# Patient Record
Sex: Female | Born: 1960
Health system: Southern US, Community
[De-identification: ages and names within clinical notes are randomized; demographics above are authoritative.]

## PROBLEM LIST (undated history)

## (undated) DIAGNOSIS — K5792 Diverticulitis of intestine, part unspecified, without perforation or abscess without bleeding: Secondary | ICD-10-CM

## (undated) DIAGNOSIS — Z87898 Personal history of other specified conditions: Secondary | ICD-10-CM

## (undated) DIAGNOSIS — M25579 Pain in unspecified ankle and joints of unspecified foot: Secondary | ICD-10-CM

## (undated) DIAGNOSIS — R1310 Dysphagia, unspecified: Secondary | ICD-10-CM

## (undated) DIAGNOSIS — M76829 Posterior tibial tendinitis, unspecified leg: Secondary | ICD-10-CM

## (undated) DIAGNOSIS — K449 Diaphragmatic hernia without obstruction or gangrene: Secondary | ICD-10-CM

## (undated) DIAGNOSIS — M21619 Bunion of unspecified foot: Secondary | ICD-10-CM

## (undated) HISTORY — PX: ROTATOR CUFF REPAIR: SHX139

## (undated) HISTORY — DX: Bunion of unspecified foot: M21.619

## (undated) HISTORY — PX: ESOPHAGEAL DILATION: SHX303

## (undated) HISTORY — DX: Posterior tibial tendinitis, unspecified leg: M76.829

## (undated) HISTORY — PX: CALCANEAL OSTEOTOMY: SHX1281

## (undated) HISTORY — DX: Diverticulitis of intestine, part unspecified, without perforation or abscess without bleeding: K57.92

## (undated) HISTORY — DX: Diaphragmatic hernia without obstruction or gangrene: K44.9

## (undated) HISTORY — DX: Dysphagia, unspecified: R13.10

## (undated) HISTORY — DX: Pain in unspecified ankle and joints of unspecified foot: M25.579

## (undated) HISTORY — PX: ESOPHAGOGASTRODUODENOSCOPY: SHX1529

## (undated) HISTORY — DX: Personal history of other specified conditions: Z87.898

---

## 1999-01-24 ENCOUNTER — Other Ambulatory Visit: Admission: RE | Admit: 1999-01-24 | Discharge: 1999-01-24 | Payer: Self-pay | Admitting: *Deleted

## 2000-02-13 ENCOUNTER — Other Ambulatory Visit: Admission: RE | Admit: 2000-02-13 | Discharge: 2000-02-13 | Payer: Self-pay | Admitting: *Deleted

## 2001-03-26 ENCOUNTER — Other Ambulatory Visit: Admission: RE | Admit: 2001-03-26 | Discharge: 2001-03-26 | Payer: Self-pay | Admitting: *Deleted

## 2002-04-29 ENCOUNTER — Other Ambulatory Visit: Admission: RE | Admit: 2002-04-29 | Discharge: 2002-04-29 | Payer: Self-pay | Admitting: *Deleted

## 2002-11-28 ENCOUNTER — Encounter: Admission: RE | Admit: 2002-11-28 | Discharge: 2002-11-28 | Payer: Self-pay | Admitting: Internal Medicine

## 2002-11-28 ENCOUNTER — Encounter: Payer: Self-pay | Admitting: Internal Medicine

## 2003-06-30 ENCOUNTER — Other Ambulatory Visit: Admission: RE | Admit: 2003-06-30 | Discharge: 2003-06-30 | Payer: Self-pay | Admitting: *Deleted

## 2004-08-08 ENCOUNTER — Other Ambulatory Visit: Admission: RE | Admit: 2004-08-08 | Discharge: 2004-08-08 | Payer: Self-pay | Admitting: *Deleted

## 2005-01-04 ENCOUNTER — Ambulatory Visit (HOSPITAL_BASED_OUTPATIENT_CLINIC_OR_DEPARTMENT_OTHER): Admission: RE | Admit: 2005-01-04 | Discharge: 2005-01-04 | Payer: Self-pay | Admitting: Orthopedic Surgery

## 2005-11-06 HISTORY — PX: TRIGGER FINGER RELEASE: SHX641

## 2006-03-20 ENCOUNTER — Ambulatory Visit (HOSPITAL_COMMUNITY): Admission: RE | Admit: 2006-03-20 | Discharge: 2006-03-20 | Payer: Self-pay | Admitting: Gastroenterology

## 2006-03-21 ENCOUNTER — Other Ambulatory Visit: Admission: RE | Admit: 2006-03-21 | Discharge: 2006-03-21 | Payer: Self-pay | Admitting: *Deleted

## 2006-11-06 HISTORY — PX: ENDOMETRIAL ABLATION: SHX621

## 2007-01-09 ENCOUNTER — Encounter: Admission: RE | Admit: 2007-01-09 | Discharge: 2007-01-09 | Payer: Self-pay | Admitting: Radiation Oncology

## 2007-04-15 ENCOUNTER — Other Ambulatory Visit: Admission: RE | Admit: 2007-04-15 | Discharge: 2007-04-15 | Payer: Self-pay | Admitting: *Deleted

## 2008-06-29 ENCOUNTER — Other Ambulatory Visit: Admission: RE | Admit: 2008-06-29 | Discharge: 2008-06-29 | Payer: Self-pay | Admitting: Gynecology

## 2008-08-18 ENCOUNTER — Ambulatory Visit (HOSPITAL_BASED_OUTPATIENT_CLINIC_OR_DEPARTMENT_OTHER): Admission: RE | Admit: 2008-08-18 | Discharge: 2008-08-18 | Payer: Self-pay | Admitting: Orthopedic Surgery

## 2008-11-06 HISTORY — PX: BUNIONECTOMY: SHX129

## 2010-06-02 ENCOUNTER — Ambulatory Visit (HOSPITAL_COMMUNITY): Admission: RE | Admit: 2010-06-02 | Discharge: 2010-06-02 | Payer: Self-pay | Admitting: Gastroenterology

## 2011-03-21 NOTE — Op Note (Signed)
NAME:  Jill Lindsey, Jill Lindsey NO.:  [id number]   MEDICAL RECORD NO.:  [id number]          PATIENT TYPE:  AMB   LOCATION:  DSC                          FACILITY:  [facility]   PHYSICIAN:  Leonides Grills, M.D.     DATE OF BIRTH:  April 19, 1961   DATE OF PROCEDURE:  08/18/2008  DATE OF DISCHARGE:                               OPERATIVE REPORT   PREOPERATIVE DIAGNOSES:  1. Right hallux valgus.  2. Right second hammertoe.   POSTOPERATIVE DIAGNOSES:  1. Right hallux valgus.  2. Right second hammertoe.   OPERATION:  1. Right Chevron bunionectomy.  2. Stress x-rays, right foot.  3. Right great toe digital nerve neurolysis.  4. Right second toe MTP joint dorsal capsulotomy with collateral      release.  5. Right second toe proximal phalanx head resection.  6. Right second toe FDL to proximal phalanx tendon transfer.  7. Right second toe EDB to EDL tendon transfer.   ANESTHESIA:  General.   SURGEON:  Leonides Grills, MD   ASSISTANT:  Richardean Canal, PA   ESTIMATED BLOOD LOSS:  Minimal.   TOURNIQUET TIME:  Approximately an hour.   COMPLICATIONS:  None.   DISPOSITION:  Stable to the PR.   INDICATIONS:  This is a 50 year old female who has had long-standing  right bunion second toe pain that was interfering with her life despite  wearing wider extra-depth shoes, wearing several different pads, and  taking anti-inflammatories.  She was sent for the above procedure.  All  risks including infection, vessel injury, nonunion, malunion, hardware  irritation, hardware failure, persistent pain, worsening pain, prolonged  recovery, stiffness, arthritis, development of recurrent hallux valgus  deformity, development of hallux varus deformity, and development of  recurrence of the hammertoe deformity or cock-up toe deformity were all  explained.  Questions were encouraged and answered.   OPERATION:  The patient was brought to the operating room and placed in  supine position.  After  adequate general endotracheal anesthesia was  administered as well as Ancef 1 g IV piggyback, the right lower  extremity was then prepped and draped in sterile manner over a  proximally placed thigh tourniquet.  Limb was gravity exsanguinated and  tourniquet was inflated to 290 mmHg.  A longitudinal incision in midline  over the medial aspect of the right great toe MTP joint was then made.  Dissection was carried down through the skin.  Hemostasis was obtained.  Great toe dorsomedial digital nerve was then carefully dissected out and  was retracted out of harm's way throughout the procedure.  L-shaped  capsulotomy was then made.  Simple bunionectomy was then performed with  a sagittal saw.  Alona Bene ridge was then rounded off with a rongeur.  Lateral capsule was released with a curved Beaver blade, protecting the  cartilage with a Freer.  This had an outstanding release of the tight  capsule.  The joint and area were copiously irrigated with normal  saline.  Center of the head was then identified medially, marked, and  protecting the soft tissue superiorly and inferiorly.  A Chevron  osteotomy was then made with a sagittal saw.  The head was then  translated about 3-4 mm laterally and fixed with a 2.0-mm fully threaded  cortical set screws using a 1.5-mm drill hole respectively.  This had  excellent purchase and maintenance of the correction.  Redundant bone  medially was trimmed off with a sagittal saw.  Joint and area were  copiously irrigated with normal saline.  Toe was held reduced and the  capsule was reconstructed and advanced both superiorly and proximally  with 2-0 Vicryl stitch.  This had an Conservation officer, historic buildings.  Stress x-rays  were obtained in the AP and lateral planes and showed no gross motion,  fixation, proposition, and excellent as well.  Sesamoid was located.  Again, the area was copiously irrigated with normal saline.  A  longitudinal incision was then made over the  right second toe.  Dissection was carried down through the skin.  Hemostasis was obtained.  The EDB and EDL tendons were identified.  The EDL tendon was tenotomized  proximal medial and the brevis distal lateral and retracted of harm's  way throughout the procedure.  A dorsal capsulotomy with collateral  release of the MTP joint was then performed, protecting the soft tissues  both medial and laterally.  This had an outstanding release of the tight  capsule.  We then skeletonized the distal aspect of the proximal phalanx  and removed the head with a rongeur followed by a bone cutter.  This cut  was made perpendicular to the long axis of proximal phalanx.  A  longitudinal incision was then made into the PIP joint plantar plate.  FDL tendon was then identified and carefully dissected out and then  tenotomized as distal as possible.  We then made a drill hole with a 2.5  followed by 3.5-mm drill into the base of the second metatarsal and then  transferred the FDL to the proximal phalanx to this drill hole from  plantar to dorsal using 3-0 PDS stitch.  This had an Conservation officer, historic buildings.  We then placed a 0.054 K-wire antegrade through the middle and distal  phalanx, reduced the PIP joint as well as the MTP joint, and tension on  the FDL tendon through the drill hole fired the K-wire across the MTP  joint.  This had an Conservation officer, historic buildings.  There was a little been  resistance due to the fact that I think she fractured this toe in the  past and there were some diaphyseal callus.  Once this was done, the toe  was in excellent position.  We then transferred the EDB to EDL dorsally  using 3-0 PDS stitch and again this had an outstanding repair as well.  This was sewn to the stump of the FdL tendon.  K-wire was bent, cut, and  capped.  Skin relieving incisions were made on either side of the K-  wire.  The tourniquet was deflated.  Hemostasis was obtained.  The toe  pinked up nicely.  All wounds were  copiously irrigated with normal  saline.  Subcu was closed with 3-0 Vicryl, skin was closed with 4-0  nylon over all wounds.  Sterile dressing was applied.  Roger-Mann  dressing was applied.  Hard-sole shoe was applied.  The patient was  stable to PR.      Leonides Grills, M.D.  Electronically Signed     PB/MEDQ  D:  08/18/2008  T:  08/18/2008  Job:  [id number]

## 2011-03-24 NOTE — Op Note (Signed)
NAME:  Jill Lindsey, Jill Lindsey NO.:  [id number]   MEDICAL RECORD NO.:  [id number]          PATIENT TYPE:  AMB   LOCATION:  DSC                          FACILITY:  [facility]   PHYSICIAN:  Leonides Grills, M.D.     DATE OF BIRTH:  08-07-1961   DATE OF PROCEDURE:  01/04/2005  DATE OF DISCHARGE:                                 OPERATIVE REPORT   PREOPERATIVE DIAGNOSES:  1.  Left hindfoot malposition.  2.  Posterior tibial tendinitis.  3.  Left tight gastrocnemius.   POSTOPERATIVE DIAGNOSES:  1.  Left hindfoot malposition.  2.  Posterior tibial tendinitis.  3.  Left tight gastrocnemius.   OPERATION:  1.  Left lengthening calcaneal osteotomy.  2.  Left iliac crest bone graft.  3.  Left medializing calcaneal osteotomy.  4.  Left FDL to posterior tendon transfer inside left gastrocnemius slide.   SURGEON:  Leonides Grills, M.D.   ANESTHESIA:  General endotracheal tube -- __________   ESTIMATED BLOOD LOSS:  Minimal.   TOURNIQUET TIME:  Approximately 1-1/2 hours.   COMPLICATIONS:  None.   DISPOSITION:  Stable to recovery room.   INDICATIONS:  This is a 50 year old female who has had longstanding left  posteromedial ankle pain that was interfering with her life.  Once consent  was obtained she was sent for the above procedure.  All risks which included  infection, neurovascular injury, nonunion, malunion, hardware failure,  persistent pain, worsening stiffness, arthritis, same pain, worsening pain,  prolonged recovery, hardware irritation, hardware failure, and hardware  removal were all explained; questions were encouraged and answered.   DESCRIPTION OF PROCEDURE:  The patient was brought to the operating room and  placed in the supine position after adequate general endotracheal anesthesia  was administered as well as Ancef 1 gm IV piggyback.  The left lower  extremity as well as the left iliac crest bone graft site were prepped and  draped in a sterile manner over a  proximally placed thigh tourniquet.   We started the procedure with a longitudinal incision over the left iliac  crest.  Dissection was carried down through the skin and hemostasis was  obtained.  Dissection was carried down directly to the crest.  Soft tissue  was elevated over the inner and outer tables and we were using a sagittal  saw and a curved 1/4-inch osteotome; an approximately 8-to-10-mm wide based  tricortical bone block was then obtained.  This was trapezoidal shaped.  It  was placed on the back table for later lengthening.  This area was copiously  irrigated with normal saline.  Bone wax was applied to expose the bony  surfaces.  The area was copiously irrigated with normal saline.  Marcaine  was placed in the wound subcu.  Once this was basted for several minutes the  Marcaine was then sucked out.  The area was copiously irrigated with normal  saline and the deep tissues were closed with #0 Vicryl, subcu closed with 2-  0 Vicryl and the skin was closed with 4-0 Monocryl subcuticular stitch.  Steri-Strips were applied.  We then placed a longitudinal incision over the medial gastrocnemius muscle  tendinous junction. Dissection was carried down through skin.  Hemostasis  was obtained.  The fascia was then opened in line with the incision.  A  tucked conjoined region was then developed between the gastrosoleus.  Soft  tissue was then elevated off the posterior aspect of the gastrocnemius  tendon.  Sural nerve was identified and protected posteriorly.  Gastrocnemius was then released with the Mayo scissors with release of the  tight gastrocnemius.  The area was copiously irrigated with normal saline.  The subcu was closed with 3-0 Vicryl and the skin was closed with 4-0  Monocryl subcuticular stitch.  Steri-Strips were applied.   The limb was then gravity exsanguinated and tourniquet was elevated to 290  mmHg. A longitudinal incision of the posterior tibial tendon was then  made  and dissection was carried down through skin and hemostasis was obtained.  The flexor retinaculum was then opened over the posterior tibial tendon.  The posterior tendon was diseased.  There was no distinct tear. It was  swollen, but we decided to keep the tendon, at this point.  We then  identified the FDL tendon and traced this to the knot of Henry.  This was  then tenotomized and left in the wound for later transfer.  We then went to  the lateral aspect of the foot and made  a longitudinal incision over the  neck of the calcaneus.  Dissection was carried down through the skin and  hemostasis was obtained.  Soft tissue was then elevated over either aspect  of the calcaneal neck.  The CC joint was then identified.  Approximately 1.2  cm proximal to this an osteotomy was made with the sagittal saw  perpendicular to the lateral wall parallel to the CC joint.  We then placed  a small Lambert spreader and fashioned the tricortical bone block.  This was  then tapped into placed and then affixed with a 3.5 mm fully threaded  cortical screw using a 2.5 mm drill hole respectively.  This had an  excellent repair.  This was flush on all surfaces and did not have any  significant impingement.   We then made a longitudinal incision over the lateral calcaneal tuber.  Dissection was carried down directly to bone.  The soft tissue was elevated  over either aspect, superiorly and inferiorly over the calcaneal tuber.  Homan's were then placed on either side and with a 4000 sagittal saw an  osteotomy was then created.  A straight osteotome followed by a large  Lambert spreader was then placed.  This stretched the soft tissues and then  the calcaneus was then medialized approximately 8-10 mm.  This was then  provisionally fixed with a 2.0 K-wire.  A longitudinal incision was then  made midline over the posterior aspect of the calcaneus at the __________ skin border. Dissection was carried down  directly to bone.  Two 6.5 mm, 16  mm threaded cancellous screws, were then placed using 2.5 and 3.2 mm drill  holes respectively.  This had excellent purchase and maintenance of the  correction.   Stress x-rays were obtained in the lateral, axial, and AP hind foot and  showed that the fixations were in proper position and there was no gross  motion of the osteotomy site and the correction was excellent.  We then went  back to the medial aspect of the foot and we then transferred the FDL to  the  posterior tibial tendon using 2-0 FiberWire.  This had an excellent repair.  The area was copiously irrigated with normal saline.  The tourniquet was  inflated.  Hemostasis was obtained.  The flexor retinaculum was closed with 2-0 Vicryl.  The subcu was closed with 2-0 Vicryl. Skin was closed with 4-0 Vicryl over  all wounds.  Sterile dressing was applied.  Modified Jones dressing was  applied with the ankle in neutral dorsiflexion.  The patient was stable to  the RR.      PB/MEDQ  D:  01/04/2005  T:  01/04/2005  Job:  [id number]

## 2011-03-24 NOTE — Op Note (Signed)
NAME:  Jill Lindsey, Jill Lindsey                  ACCOUNT NO.:  [id number]   MEDICAL RECORD NO.:  [id number]          PATIENT TYPE:  AMB   LOCATION:  ENDO                         FACILITY:  [facility]   PHYSICIAN:  James L. Malon Kindle., M.D.DATE OF BIRTH:  07/20/1961   DATE OF PROCEDURE:  03/20/2006  DATE OF DISCHARGE:                                 OPERATIVE REPORT   PROCEDURE PERFORMED:  Esophagogastroduodenoscopy with Savary dilatation.   MEDICATIONS:  Cetacaine spray, fentanyl 75 mcg, Versed 8 mg IV.   ENDOSCOPIST:  Llana Aliment. Randa Evens, M.D.   INDICATIONS FOR PROCEDURE:  The patient has had persistent episodic  dysphagia, has had a history of heartburn, had a barium swallow that showed  narrowing GE junction with hang up of a barium tablet.  The patient also had  a large hiatal hernia.  For this reason, we felt we should do it initially  under fluoroscopy.   DESCRIPTION OF PROCEDURE:  The procedure had been explained to the patient  and consent obtained.  With the patient in left lateral decubitus position,  the Olympus scope was inserted and advanced.  In the distal esophagus there  was a very wide stricture which was more of a ring but almost was the  diameter of the distended esophagus.  This would clearly not have caused any  type of problems.  We then came to the distal esophagus.  There was a rather  large hiatal hernia.  Right at the junction there was a ring or stricture  narrow but did allow passage of the scope.  A complete endoscopy was  performed.  The scope was passed down into the duodenum. The stomach was  normal on forward and retroflex view.  The duodenal bulb and second duodenum  were normal.  I then placed a Savary guidewire through the scope into the  second duodenum and confirmed with fluoroscopy and withdrew the scope.  Then  with the patient's neck extended, we passed a 39, 42 and 45 French Savary  dilator with a small amount of heme with a 45 [place] dilator.  There were no  immediate complications.  The dilator and guidewire were withdrawn as a  unit.  The patient went to recovery room.   ASSESSMENT:  Esophageal stricture at the gastroesophageal junction, dilated  to 45 [place].   PLAN:  Will give clear liquids until morning, slowly advance diet.  Have her  call and report in one week if she is still having dysphagia, further  dilation can be performed.           ______________________________  Llana Aliment. Malon Kindle., M.D.     Waldron Session  D:  03/20/2006  T:  03/21/2006  Job:  [id number]   cc:   Dellis Anes. Idell Pickles, M.D.  Fax: [phone]

## 2011-03-30 ENCOUNTER — Other Ambulatory Visit: Payer: Self-pay | Admitting: Family Medicine

## 2011-03-30 ENCOUNTER — Other Ambulatory Visit (HOSPITAL_COMMUNITY)
Admission: RE | Admit: 2011-03-30 | Discharge: 2011-03-30 | Disposition: A | Discharge status: Home / Self Care | Payer: 59 | Source: Ambulatory Visit | Attending: Family Medicine | Admitting: Family Medicine

## 2011-03-30 DIAGNOSIS — Z124 Encounter for screening for malignant neoplasm of cervix: Secondary | ICD-10-CM | POA: Insufficient documentation

## 2012-03-14 ENCOUNTER — Other Ambulatory Visit: Payer: Self-pay | Admitting: Family Medicine

## 2012-03-14 DIAGNOSIS — R1032 Left lower quadrant pain: Secondary | ICD-10-CM

## 2012-03-15 ENCOUNTER — Ambulatory Visit
Admission: RE | Admit: 2012-03-15 | Discharge: 2012-03-15 | Disposition: A | Discharge status: Home / Self Care | Payer: 59 | Source: Ambulatory Visit | Attending: Family Medicine | Admitting: Family Medicine

## 2012-03-15 DIAGNOSIS — R1032 Left lower quadrant pain: Secondary | ICD-10-CM

## 2012-03-15 MED ORDER — IOHEXOL 300 MG/ML  SOLN
100.0000 mL | Freq: Once | INTRAMUSCULAR | Status: AC | PRN
  Administered 2012-03-15: 100 mL via INTRAVENOUS

## 2012-06-16 IMAGING — CT CT ABD-PELV W/ CM
2 of 5 series · 13 of 32 positions shown, 18 images · IV contrast (READICAT/WATER & [ID] OMNI 300)
Comparison: None.

CLINICAL DATA: Left lower quadrant pain

CT ABDOMEN AND PELVIS WITH CONTRAST
TECHNIQUE: Multidetector CT imaging of the abdomen and pelvis was
performed following the standard protocol during bolus
administration of intravenous contrast.
Contrast: 100mL OMNIPAQUE IOHEXOL 300 MG/ML  SOLN

[Series 2: abd/pelvis with · axial · 0.86mm/px · z∈[-348,-48]mm · 5 of 90 slices shown, 10 images]
[im 15/90  soft-tissue]
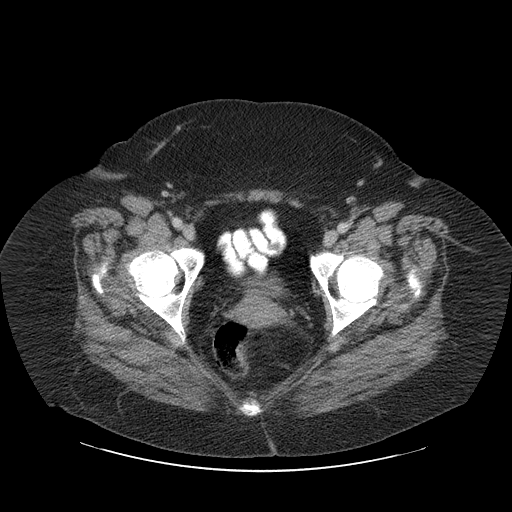
[im 15/90  bone]
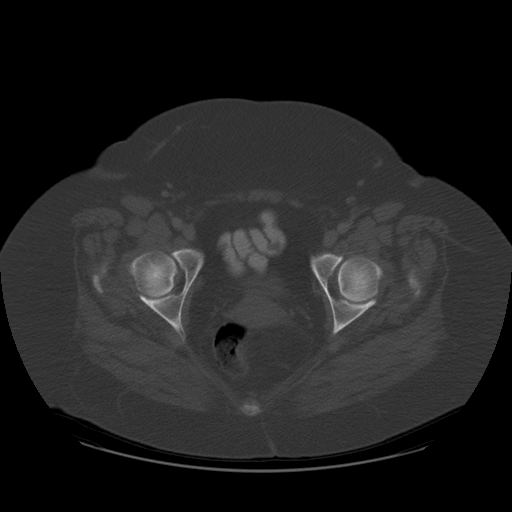
[im 30/90  soft-tissue]
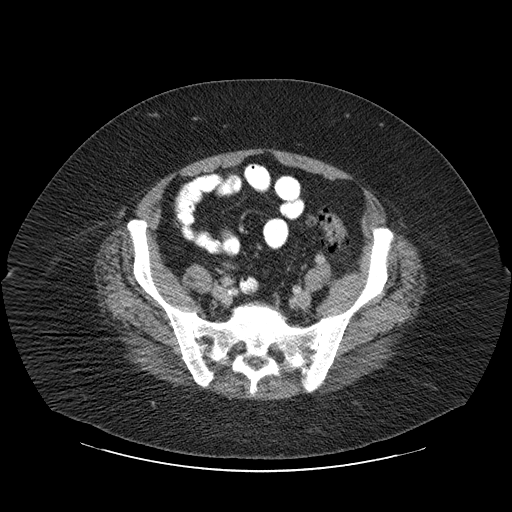
[im 30/90  lung]
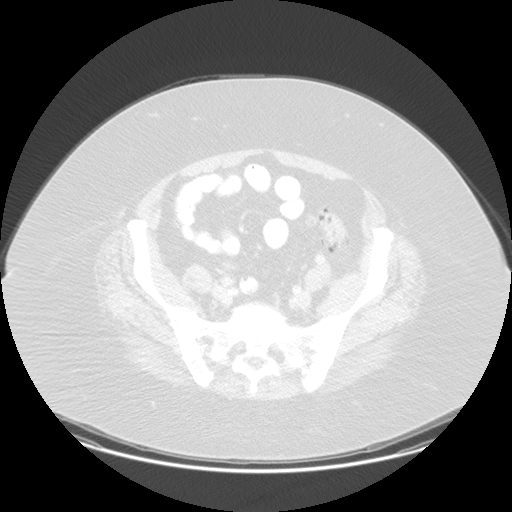
[im 45/90  soft-tissue]
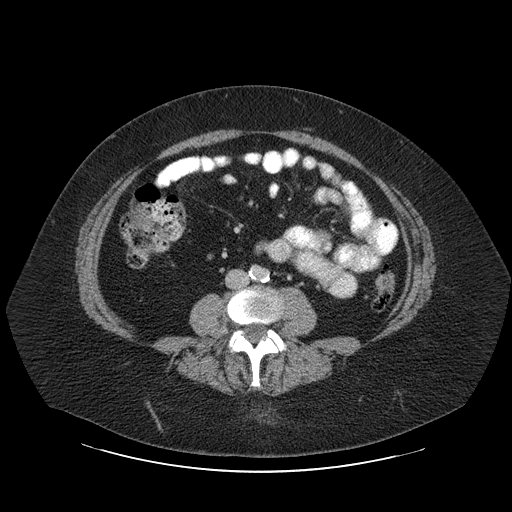
[im 45/90  lung]
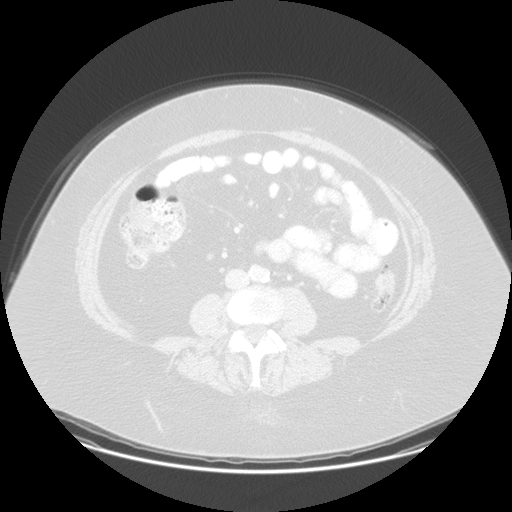
[im 60/90  soft-tissue]
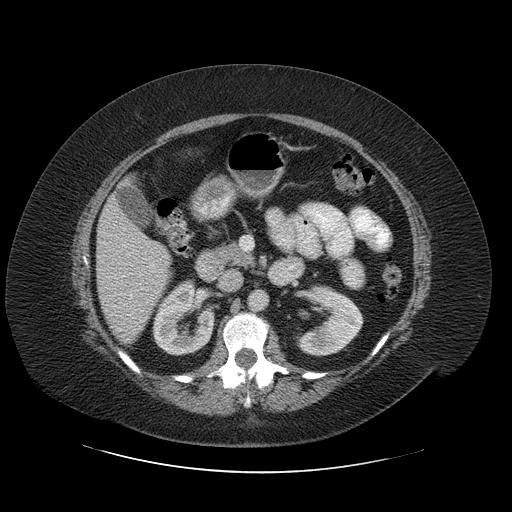
[im 60/90  lung]
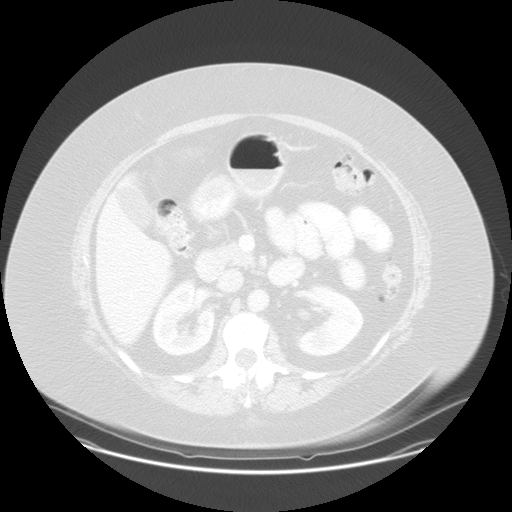
[im 75/90  soft-tissue]
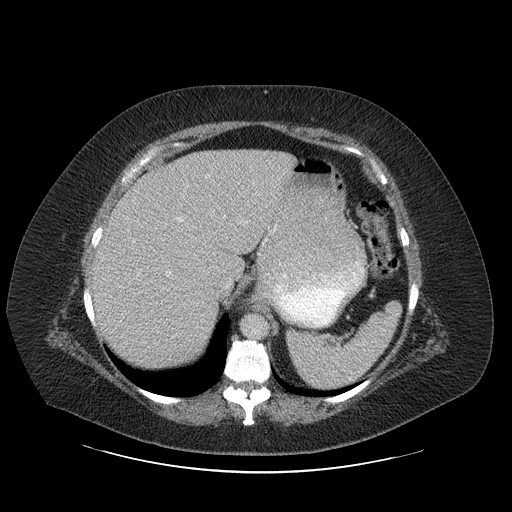
[im 75/90  lung]
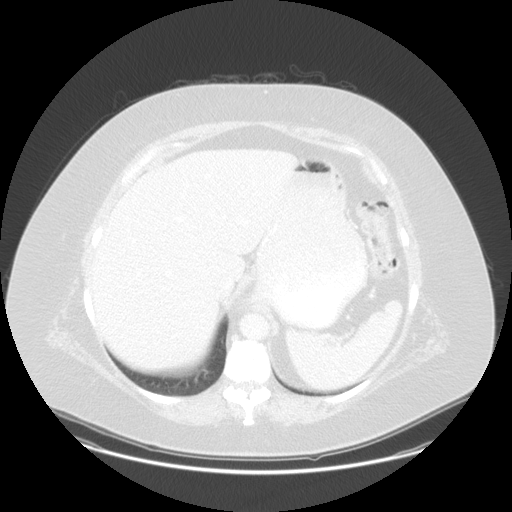

[Series 400: sag · sagittal · 0.89mm/px · 8 of 175 slices shown]
[im 16/175  soft-tissue]
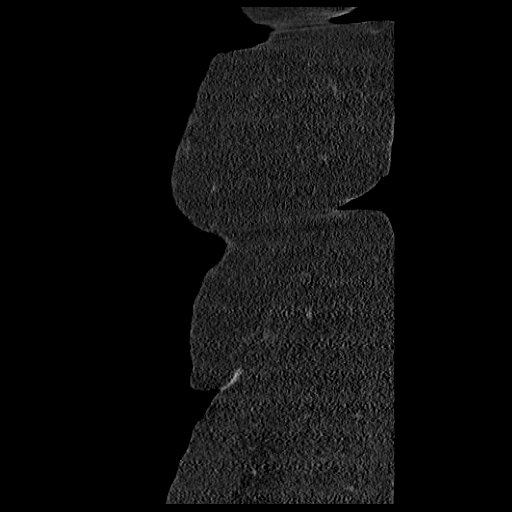
[im 32/175  soft-tissue]
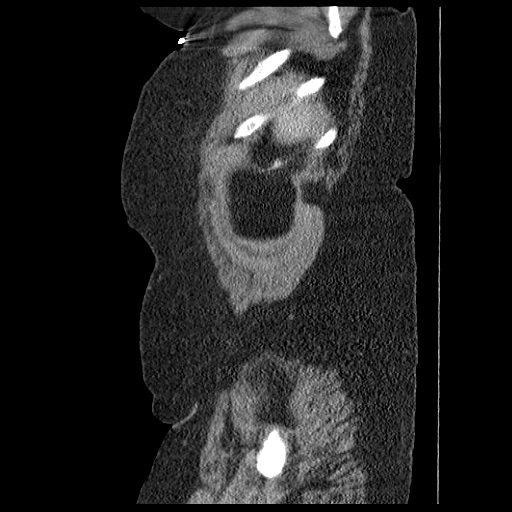
[im 64/175  soft-tissue]
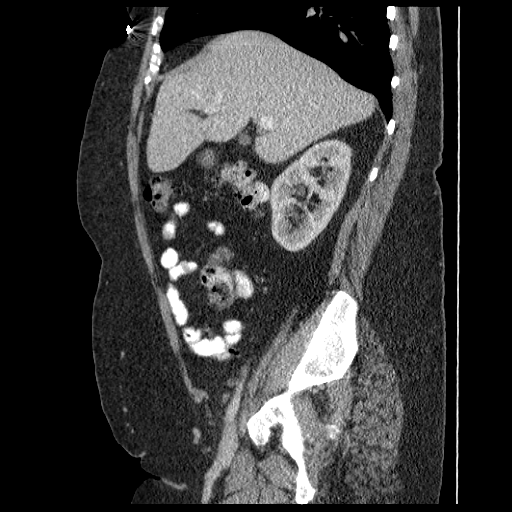
[im 80/175  soft-tissue]
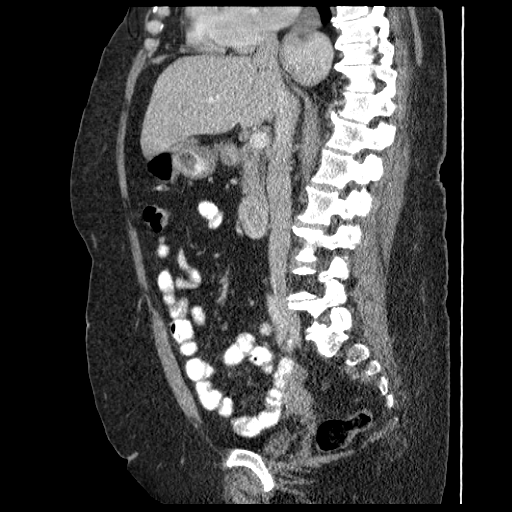
[im 95/175  soft-tissue]
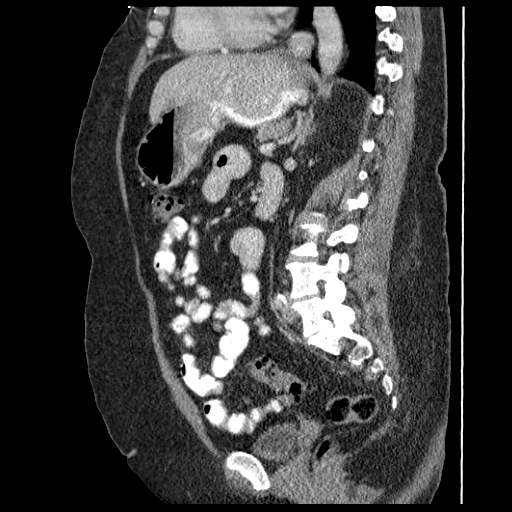
[im 111/175  soft-tissue]
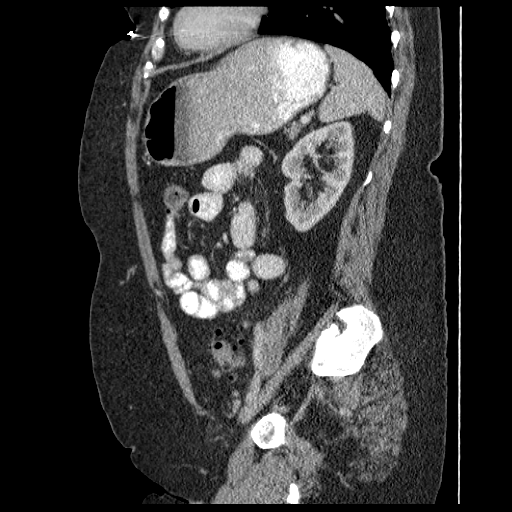
[im 143/175  soft-tissue]
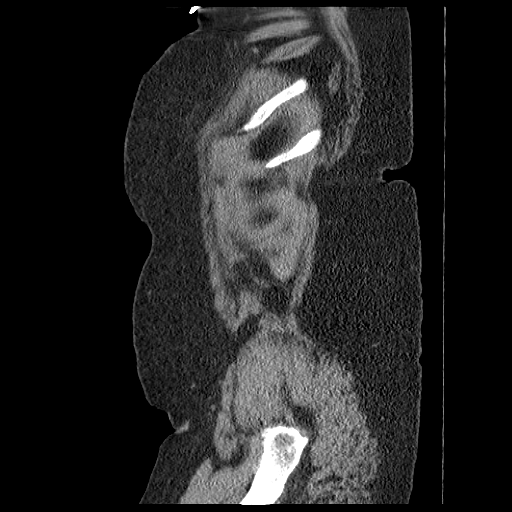
[im 159/175  soft-tissue]
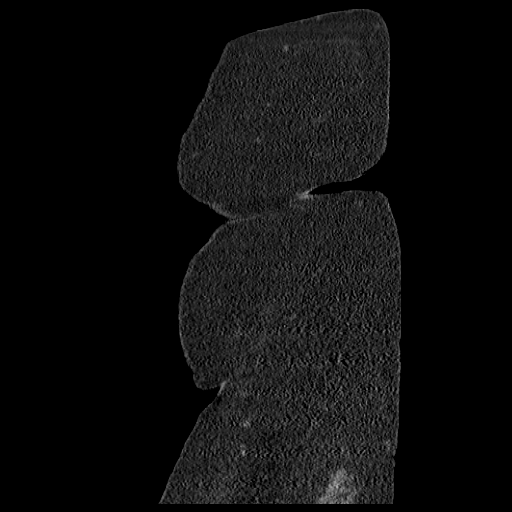

[13 of 32 positions shown; findings below may reference images not displayed]

FINDINGS: Sagittal images of the lumbar spine shows degenerative
changes thoracolumbar spine.  Disc space flattening with mild
anterior and mild posterior spurring noted at L5 S1 and L4-L5
level.

Moderate sized hiatal hernia is noted measures 5.7 x 4.4 cm.  Lung
bases are unremarkable.

Enhanced liver, spleen, pancreas and adrenal glands are
unremarkable.  Gallbladder is contracted without evidence of
calcified gallstones.

Enhanced kidneys are symmetrical in size.  No hydronephrosis or
hydroureter.

Delayed renal images shows bilateral renal symmetrical excretion.
Mild atherosclerotic calcifications are noted abdominal aorta and
bilateral iliac arteries.

Oral contrast material was given to the patient.  There is no
aortic aneurysm.  No small bowel or colonic obstruction.

No pericecal inflammation.  The terminal ileum is unremarkable.
Normal appendix is clearly visualized in axial image 62 and coronal
image 79.

There are multiple distal left colon diverticula.  Multiple sigmoid
colon diverticula are noted.  In axial image 55 and coronal image
67 there is a mild stranding of the pericolonic fat in the distal
left colon left lower quadrant.  Findings are consistent with mild
diverticulitis.  No pericolonic abscess is noted.  No extraluminal
air or contrast material.  The uterus is small size unremarkable.
No adnexal mass.  The urinary bladder is under distended
unremarkable.  No pelvic ascites or adenopathy.  There is a defect
in the left iliac bone this may be due to prior injury or prior
bone harvest.
IMPRESSION: 1.  Distal left colon and sigmoid colon multiple diverticula are
noted.  There is mild stranding of the pericolonic fat in left
lower quadrant distal left colon best seen in axial image 56.  The
findings are highly suspicious for mild diverticulitis.  No peri
colonic abscess or perforation is noted.
2.  Moderate sized hiatal hernia measures 5.7 x 4.4 cm.
3.  Mild atherosclerotic calcifications of the abdominal aorta and
the iliac arteries.  No aortic aneurysm.
4.  No pericecal inflammation normal appendix.
5.  No hydronephrosis or hydroureter.

## 2013-11-10 ENCOUNTER — Ambulatory Visit: Payer: 59 | Admitting: Internal Medicine

## 2013-11-11 ENCOUNTER — Encounter: Payer: Self-pay | Admitting: Internal Medicine

## 2013-11-21 ENCOUNTER — Ambulatory Visit (INDEPENDENT_AMBULATORY_CARE_PROVIDER_SITE_OTHER): Payer: 59 | Admitting: Internal Medicine

## 2013-11-21 ENCOUNTER — Encounter: Payer: Self-pay | Admitting: Internal Medicine

## 2013-11-21 VITALS — BP 122/72 | HR 64 | Ht 65.0 in | Wt 225.0 lb

## 2013-11-21 DIAGNOSIS — R079 Chest pain, unspecified: Secondary | ICD-10-CM

## 2013-11-21 NOTE — Progress Notes (Signed)
HPI  Patient is a 53 yo who was referred fro evaluation of CP  She is followed by Dr Ihor Dow at [facility].  She has a history of HTN, hyperlipidemia, GERD, esophageal stricture, sleep apnea, former smoker.  HGx with no premature CAD  Grandfather with CHF Died at 27  Patinet says sometimes can have CP while sitting.  Sharp pain  Not Associated with palpitation  Last a moment  1 to 2 sec.   Not with activity.   Goes to gym  Gets on treadmill   Tryies to get HR up for 30 min  Does strength training Sometimes if does plank has muscle spasm. Similar Has GI history  This is different  Cut back on caffeine and salt.      No Known Allergies  Current Outpatient Prescriptions  Medication Sig Dispense Refill  . atorvastatin (LIPITOR) 20 MG tablet Take 1 tablet by mouth daily.      Marland Kitchen esomeprazole (NEXIUM) 20 MG capsule Take 20 mg by mouth daily at 12 noon.      . Lutein 6 MG CAPS Take by mouth daily.      . Multiple Vitamins-Minerals (MULTIVITAMIN PO) Take by mouth daily.      . Omega-3 Fatty Acids (FISH OIL PO) Take by mouth daily.       No current facility-administered medications for this visit.    Past Medical History  Diagnosis Date  . Posterior tibial tendinitis   . Ankle pain   . Dysphagia   . History of heartburn   . Hiatal hernia   . Bunion   . Diverticulitis     Past Surgical History  Procedure Laterality Date  . Bunionectomy  2010  . Esophagogastroduodenoscopy    . Esophageal dilation    . Endometrial ablation  2008  . Trigger finger release  2007  . Calcaneal osteotomy  2006, 2012    right, left    No family history on file.  History   Social History  . Marital Status: Married    Spouse Name: N/A    Number of Children: N/A  . Years of Education: N/A   Occupational History  . Not on file.   Social History Main Topics  . Smoking status: Never Smoker   . Smokeless tobacco: Not on file  . Alcohol Use: No  . Drug Use: No  . Sexual Activity: Not on file    Other Topics Concern  . Not on file   Social History Narrative  . No narrative on file    Review of Systems:  All systems reviewed.  They are negative to the above problem except as previously stated.  Vital Signs: BP 122/72  Pulse 64  Ht 5\' 5"  (1.651 m)  Wt 225 lb (102.059 kg)  BMI 37.44 kg/m2  Physical Exam Patient is in NAD HEENT:  Normocephalic, atraumatic. EOMI, PERRLA.  Neck: JVP is normal.  No bruits.  Lungs: clear to auscultation. No rales no wheezes.  Heart: Regular rate and rhythm. Normal S1, S2. No S3.   No significant murmurs. PMI not displaced.  Abdomen:  Supple, nontender. Normal bowel sounds. No masses. No hepatomegaly.  Extremities:   Good distal pulses throughout. No lower extremity edema.  Musculoskeletal :moving all extremities.  Neuro:   alert and oriented x3.  CN II-XII grossly intact.  EKG  SR 64 bpm    Assessment and Plan:  1.  CP  I am not convinced discomfort is cardiac in origin.  Agree with  cutting back on caffeine.  I would not recomm further testing.  2.  Palpatitations.  Sound like isolated skips.  I would not recomm Rx.  2.  Blood pressure.  BP is good today  3  Morbid obesity  Needs to lose wt to reduce her cardiac risk profile  She knows this  Will try.  4.  Dyslipidemia.  LDL is 102 on lipitor.  Trig are high at 207 Needs to lose wt.  Watch carbs   Both should improve if these changes made  I would not push meds for now.

## 2013-11-21 NOTE — Patient Instructions (Signed)
The current medical regimen is effective;  continue present plan and medications.  Follow up as needed 

## 2014-09-08 ENCOUNTER — Other Ambulatory Visit: Payer: Self-pay | Admitting: Gynecology

## 2014-09-09 LAB — CYTOLOGY - PAP

## 2016-03-06 DIAGNOSIS — M75100 Unspecified rotator cuff tear or rupture of unspecified shoulder, not specified as traumatic: Secondary | ICD-10-CM

## 2016-03-06 HISTORY — DX: Unspecified rotator cuff tear or rupture of unspecified shoulder, not specified as traumatic: M75.100

## 2018-08-06 DIAGNOSIS — M25511 Pain in right shoulder: Secondary | ICD-10-CM | POA: Diagnosis not present

## 2018-08-06 DIAGNOSIS — G8928 Other chronic postprocedural pain: Secondary | ICD-10-CM | POA: Diagnosis not present

## 2018-08-06 DIAGNOSIS — Z9889 Other specified postprocedural states: Secondary | ICD-10-CM | POA: Diagnosis not present

## 2018-08-06 DIAGNOSIS — M6281 Muscle weakness (generalized): Secondary | ICD-10-CM | POA: Diagnosis not present

## 2018-08-22 DIAGNOSIS — M25511 Pain in right shoulder: Secondary | ICD-10-CM | POA: Diagnosis not present

## 2018-08-22 DIAGNOSIS — M6281 Muscle weakness (generalized): Secondary | ICD-10-CM | POA: Diagnosis not present

## 2018-08-22 DIAGNOSIS — G8928 Other chronic postprocedural pain: Secondary | ICD-10-CM | POA: Diagnosis not present

## 2018-09-10 DIAGNOSIS — G8928 Other chronic postprocedural pain: Secondary | ICD-10-CM | POA: Diagnosis not present

## 2018-09-10 DIAGNOSIS — M6281 Muscle weakness (generalized): Secondary | ICD-10-CM | POA: Diagnosis not present

## 2018-09-10 DIAGNOSIS — M25511 Pain in right shoulder: Secondary | ICD-10-CM | POA: Diagnosis not present

## 2018-09-26 DIAGNOSIS — M6281 Muscle weakness (generalized): Secondary | ICD-10-CM | POA: Diagnosis not present

## 2018-09-26 DIAGNOSIS — G8928 Other chronic postprocedural pain: Secondary | ICD-10-CM | POA: Diagnosis not present

## 2018-09-26 DIAGNOSIS — M25511 Pain in right shoulder: Secondary | ICD-10-CM | POA: Diagnosis not present

## 2018-10-10 DIAGNOSIS — G4733 Obstructive sleep apnea (adult) (pediatric): Secondary | ICD-10-CM | POA: Diagnosis not present

## 2018-10-16 DIAGNOSIS — M6281 Muscle weakness (generalized): Secondary | ICD-10-CM | POA: Diagnosis not present

## 2018-10-16 DIAGNOSIS — M25511 Pain in right shoulder: Secondary | ICD-10-CM | POA: Diagnosis not present

## 2018-10-16 DIAGNOSIS — G8928 Other chronic postprocedural pain: Secondary | ICD-10-CM | POA: Diagnosis not present

## 2018-12-05 DIAGNOSIS — M654 Radial styloid tenosynovitis [de Quervain]: Secondary | ICD-10-CM | POA: Diagnosis not present

## 2018-12-05 DIAGNOSIS — M25531 Pain in right wrist: Secondary | ICD-10-CM | POA: Diagnosis not present

## 2018-12-19 DIAGNOSIS — J101 Influenza due to other identified influenza virus with other respiratory manifestations: Secondary | ICD-10-CM | POA: Diagnosis not present

## 2018-12-19 DIAGNOSIS — R6889 Other general symptoms and signs: Secondary | ICD-10-CM | POA: Diagnosis not present

## 2019-01-24 DIAGNOSIS — M25531 Pain in right wrist: Secondary | ICD-10-CM | POA: Diagnosis not present

## 2019-01-24 DIAGNOSIS — M654 Radial styloid tenosynovitis [de Quervain]: Secondary | ICD-10-CM | POA: Diagnosis not present

## 2019-07-01 DIAGNOSIS — G4733 Obstructive sleep apnea (adult) (pediatric): Secondary | ICD-10-CM | POA: Diagnosis not present

## 2019-07-15 DIAGNOSIS — M25531 Pain in right wrist: Secondary | ICD-10-CM | POA: Diagnosis not present

## 2019-07-15 DIAGNOSIS — M654 Radial styloid tenosynovitis [de Quervain]: Secondary | ICD-10-CM | POA: Diagnosis not present

## 2019-09-26 DIAGNOSIS — Z1231 Encounter for screening mammogram for malignant neoplasm of breast: Secondary | ICD-10-CM | POA: Diagnosis not present

## 2019-10-15 DIAGNOSIS — R928 Other abnormal and inconclusive findings on diagnostic imaging of breast: Secondary | ICD-10-CM | POA: Diagnosis not present

## 2021-01-04 DIAGNOSIS — U071 COVID-19: Secondary | ICD-10-CM

## 2021-01-04 HISTORY — DX: COVID-19: U07.1

## 2021-09-07 ENCOUNTER — Other Ambulatory Visit: Payer: Self-pay

## 2021-09-07 ENCOUNTER — Encounter (HOSPITAL_BASED_OUTPATIENT_CLINIC_OR_DEPARTMENT_OTHER): Payer: Self-pay | Admitting: Specialist

## 2021-09-07 NOTE — Progress Notes (Addendum)
Spoke w/ via phone for pre-op interview---patient Lab needs dos----CBC, CMP               Lab results------none COVID test -----patient states asymptomatic no test needed Arrive at -------1015 on 09/15/21 NPO after MN NO Solid Food.  Clear liquids from MN until---0915 Med rec completed Medications to take morning of surgery -----none Diabetic medication -----n/a Patient instructed no nail polish to be worn day of surgery Patient instructed to bring photo id and insurance card day of surgery Patient aware to have Driver (ride ) / caregiver    for 24 hours after surgery - husband Brett Canales or son Reuel Boom  Patient Special Instructions -----Bring CPAP Pre-Op special Istructions -----none Patient verbalized understanding of instructions that were given at this phone interview. Patient denies shortness of breath, chest pain, fever, cough at this phone interview.

## 2021-09-12 ENCOUNTER — Encounter (HOSPITAL_BASED_OUTPATIENT_CLINIC_OR_DEPARTMENT_OTHER): Payer: Self-pay | Admitting: Specialist

## 2021-09-15 ENCOUNTER — Encounter (HOSPITAL_BASED_OUTPATIENT_CLINIC_OR_DEPARTMENT_OTHER)
Admission: RE | Disposition: A | Discharge status: Home / Self Care | Payer: Self-pay | Source: Home / Self Care | Attending: Specialist

## 2021-09-15 ENCOUNTER — Encounter (HOSPITAL_BASED_OUTPATIENT_CLINIC_OR_DEPARTMENT_OTHER): Payer: Self-pay | Admitting: Specialist

## 2021-09-15 ENCOUNTER — Other Ambulatory Visit: Payer: Self-pay

## 2021-09-15 ENCOUNTER — Ambulatory Visit (HOSPITAL_BASED_OUTPATIENT_CLINIC_OR_DEPARTMENT_OTHER): Payer: 59 | Admitting: Anesthesiology

## 2021-09-15 ENCOUNTER — Ambulatory Visit (HOSPITAL_BASED_OUTPATIENT_CLINIC_OR_DEPARTMENT_OTHER)
Admission: RE | Admit: 2021-09-15 | Discharge: 2021-09-15 | Disposition: A | Discharge status: Home / Self Care | Payer: 59 | Attending: Specialist | Admitting: Specialist

## 2021-09-15 DIAGNOSIS — K219 Gastro-esophageal reflux disease without esophagitis: Secondary | ICD-10-CM | POA: Diagnosis not present

## 2021-09-15 DIAGNOSIS — G4730 Sleep apnea, unspecified: Secondary | ICD-10-CM | POA: Diagnosis not present

## 2021-09-15 DIAGNOSIS — S83242A Other tear of medial meniscus, current injury, left knee, initial encounter: Secondary | ICD-10-CM

## 2021-09-15 DIAGNOSIS — Z87891 Personal history of nicotine dependence: Secondary | ICD-10-CM | POA: Insufficient documentation

## 2021-09-15 DIAGNOSIS — X58XXXA Exposure to other specified factors, initial encounter: Secondary | ICD-10-CM | POA: Diagnosis not present

## 2021-09-15 DIAGNOSIS — M25462 Effusion, left knee: Secondary | ICD-10-CM | POA: Insufficient documentation

## 2021-09-15 DIAGNOSIS — S83512A Sprain of anterior cruciate ligament of left knee, initial encounter: Secondary | ICD-10-CM | POA: Insufficient documentation

## 2021-09-15 DIAGNOSIS — S83232A Complex tear of medial meniscus, current injury, left knee, initial encounter: Secondary | ICD-10-CM | POA: Insufficient documentation

## 2021-09-15 DIAGNOSIS — M9420 Chondromalacia, unspecified site: Secondary | ICD-10-CM | POA: Diagnosis not present

## 2021-09-15 DIAGNOSIS — K449 Diaphragmatic hernia without obstruction or gangrene: Secondary | ICD-10-CM | POA: Insufficient documentation

## 2021-09-15 DIAGNOSIS — M94262 Chondromalacia, left knee: Secondary | ICD-10-CM | POA: Insufficient documentation

## 2021-09-15 DIAGNOSIS — Z8616 Personal history of COVID-19: Secondary | ICD-10-CM | POA: Diagnosis not present

## 2021-09-15 HISTORY — PX: KNEE ARTHROSCOPY WITH MEDIAL MENISECTOMY: SHX5651

## 2021-09-15 LAB — CBC
HCT: 40.9 % (ref 36.0–46.0)
Hemoglobin: 13.7 g/dL (ref 12.0–15.0)
MCH: 28.9 pg (ref 26.0–34.0)
MCHC: 33.5 g/dL (ref 30.0–36.0)
MCV: 86.3 fL (ref 80.0–100.0)
Platelets: 228 10*3/uL (ref 150–400)
RBC: 4.74 MIL/uL (ref 3.87–5.11)
RDW: 14 % (ref 11.5–15.5)
WBC: 5.9 10*3/uL (ref 4.0–10.5)
nRBC: 0 % (ref 0.0–0.2)

## 2021-09-15 LAB — COMPREHENSIVE METABOLIC PANEL
ALT: 21 U/L (ref 0–44)
AST: 28 U/L (ref 15–41)
Albumin: 4.5 g/dL (ref 3.5–5.0)
Alkaline Phosphatase: 92 U/L (ref 38–126)
Anion gap: 9 (ref 5–15)
BUN: 13 mg/dL (ref 6–20)
CO2: 26 mmol/L (ref 22–32)
Calcium: 9.7 mg/dL (ref 8.9–10.3)
Chloride: 104 mmol/L (ref 98–111)
Creatinine, Ser: 0.58 mg/dL (ref 0.44–1.00)
GFR, Estimated: >60 mL/min (ref >60)
Glucose, Bld: 98 mg/dL (ref 70–99)
Potassium: 3.8 mmol/L (ref 3.5–5.1)
Sodium: 139 mmol/L (ref 135–145)
Total Bilirubin: 1 mg/dL (ref 0.3–1.2)
Total Protein: 8.1 g/dL (ref 6.5–8.1)

## 2021-09-15 SURGERY — ARTHROSCOPY, KNEE, WITH MEDIAL MENISCECTOMY
Anesthesia: General | Site: Knee | Laterality: Left

## 2021-09-15 MED ORDER — ONDANSETRON HCL 4 MG/2ML IJ SOLN
INTRAMUSCULAR | Status: DC | PRN
  Administered 2021-09-15: 4 mg via INTRAVENOUS

## 2021-09-15 MED ORDER — BUPIVACAINE HCL 0.25 % IJ SOLN
INTRAMUSCULAR | Status: DC | PRN
  Administered 2021-09-15: 20 mL

## 2021-09-15 MED ORDER — CEFAZOLIN SODIUM-DEXTROSE 2-4 GM/100ML-% IV SOLN
INTRAVENOUS | Status: AC
  Filled 2021-09-15: qty 100

## 2021-09-15 MED ORDER — CEFAZOLIN SODIUM-DEXTROSE 2-4 GM/100ML-% IV SOLN
2.0000 g | INTRAVENOUS | Status: AC
  Administered 2021-09-15: 2 g via INTRAVENOUS

## 2021-09-15 MED ORDER — OXYCODONE HCL 5 MG PO TABS: 5.0000 mg | ORAL_TABLET | ORAL | 0 refills | Status: AC | PRN

## 2021-09-15 MED ORDER — ACETAMINOPHEN 500 MG PO TABS
ORAL_TABLET | ORAL | Status: AC
  Filled 2021-09-15: qty 2

## 2021-09-15 MED ORDER — ROPIVACAINE HCL 5 MG/ML IJ SOLN
INTRAMUSCULAR | Status: DC | PRN
  Administered 2021-09-15: 20 mL via INTRA_ARTICULAR

## 2021-09-15 MED ORDER — EPHEDRINE 5 MG/ML INJ
INTRAVENOUS | Status: AC
  Filled 2021-09-15: qty 5

## 2021-09-15 MED ORDER — LIDOCAINE 2% (20 MG/ML) 5 ML SYRINGE
INTRAMUSCULAR | Status: AC
  Filled 2021-09-15: qty 5

## 2021-09-15 MED ORDER — MIDAZOLAM HCL 2 MG/2ML IJ SOLN
2.0000 mg | Freq: Once | INTRAMUSCULAR | Status: AC
  Administered 2021-09-15: 2 mg via INTRAVENOUS

## 2021-09-15 MED ORDER — MORPHINE SULFATE (PF) 4 MG/ML IV SOLN
INTRAVENOUS | Status: AC
  Filled 2021-09-15: qty 1

## 2021-09-15 MED ORDER — SODIUM CHLORIDE 0.9 % IR SOLN
Status: DC | PRN
  Administered 2021-09-15: 6000 mL

## 2021-09-15 MED ORDER — KETOROLAC TROMETHAMINE 30 MG/ML IJ SOLN
INTRAMUSCULAR | Status: AC
  Filled 2021-09-15: qty 1

## 2021-09-15 MED ORDER — FENTANYL CITRATE (PF) 100 MCG/2ML IJ SOLN: 25.0000 ug | INTRAMUSCULAR | Status: DC | PRN

## 2021-09-15 MED ORDER — PROMETHAZINE HCL 25 MG/ML IJ SOLN: 6.2500 mg | INTRAMUSCULAR | Status: DC | PRN

## 2021-09-15 MED ORDER — MIDAZOLAM HCL 5 MG/5ML IJ SOLN
INTRAMUSCULAR | Status: DC | PRN
Start: 2021-09-15 — End: 2021-09-15
  Administered 2021-09-15: 2 mg via INTRAVENOUS

## 2021-09-15 MED ORDER — PROPOFOL 10 MG/ML IV BOLUS
INTRAVENOUS | Status: DC | PRN
  Administered 2021-09-15: 180 mg via INTRAVENOUS
  Administered 2021-09-15: 20 mg via INTRAVENOUS

## 2021-09-15 MED ORDER — FENTANYL CITRATE (PF) 100 MCG/2ML IJ SOLN
INTRAMUSCULAR | Status: AC
  Filled 2021-09-15: qty 2

## 2021-09-15 MED ORDER — MIDAZOLAM HCL 2 MG/2ML IJ SOLN
INTRAMUSCULAR | Status: AC
  Filled 2021-09-15: qty 2

## 2021-09-15 MED ORDER — FENTANYL CITRATE (PF) 100 MCG/2ML IJ SOLN
INTRAMUSCULAR | Status: DC | PRN
  Administered 2021-09-15 (×2): 25 ug via INTRAVENOUS
  Administered 2021-09-15: 50 ug via INTRAVENOUS

## 2021-09-15 MED ORDER — TRIAMCINOLONE ACETONIDE 40 MG/ML IJ SUSP
INTRAMUSCULAR | Status: DC | PRN
  Administered 2021-09-15: 80 mg via INTRAMUSCULAR

## 2021-09-15 MED ORDER — ACETAMINOPHEN 500 MG PO TABS
1000.0000 mg | ORAL_TABLET | Freq: Once | ORAL | Status: AC
  Administered 2021-09-15: 1000 mg via ORAL

## 2021-09-15 MED ORDER — DEXAMETHASONE SODIUM PHOSPHATE 10 MG/ML IJ SOLN
INTRAMUSCULAR | Status: AC
  Filled 2021-09-15: qty 1

## 2021-09-15 MED ORDER — ONDANSETRON HCL 4 MG/2ML IJ SOLN
INTRAMUSCULAR | Status: AC
  Filled 2021-09-15: qty 2

## 2021-09-15 MED ORDER — METHOCARBAMOL 500 MG PO TABS: 500.0000 mg | ORAL_TABLET | Freq: Four times a day (QID) | ORAL | 0 refills | Status: AC

## 2021-09-15 MED ORDER — LACTATED RINGERS IV SOLN: INTRAVENOUS | Status: DC

## 2021-09-15 MED ORDER — EPHEDRINE SULFATE 50 MG/ML IJ SOLN
INTRAMUSCULAR | Status: DC | PRN
  Administered 2021-09-15 (×2): 10 mg via INTRAVENOUS

## 2021-09-15 MED ORDER — LIDOCAINE 2% (20 MG/ML) 5 ML SYRINGE
INTRAMUSCULAR | Status: DC | PRN
  Administered 2021-09-15: 100 mg via INTRAVENOUS

## 2021-09-15 MED ORDER — DEXAMETHASONE SODIUM PHOSPHATE 10 MG/ML IJ SOLN
INTRAMUSCULAR | Status: DC | PRN
  Administered 2021-09-15: 10 mg via INTRAVENOUS

## 2021-09-15 MED ORDER — KETOROLAC TROMETHAMINE 30 MG/ML IJ SOLN
INTRAMUSCULAR | Status: DC | PRN
  Administered 2021-09-15: 15 mg via INTRAVENOUS

## 2021-09-15 MED ORDER — PROPOFOL 10 MG/ML IV BOLUS
INTRAVENOUS | Status: AC
  Filled 2021-09-15: qty 40

## 2021-09-15 MED ORDER — FENTANYL CITRATE (PF) 100 MCG/2ML IJ SOLN
50.0000 ug | Freq: Once | INTRAMUSCULAR | Status: AC
  Administered 2021-09-15: 50 ug via INTRAVENOUS

## 2021-09-15 MED ORDER — ONDANSETRON HCL 4 MG PO TABS: 4.0000 mg | ORAL_TABLET | Freq: Every day | ORAL | 1 refills | Status: AC | PRN

## 2021-09-15 MED ORDER — PROPOFOL 10 MG/ML IV BOLUS
INTRAVENOUS | Status: AC
  Filled 2021-09-15: qty 20

## 2021-09-15 MED ORDER — CEPHALEXIN 500 MG PO CAPS: 500.0000 mg | ORAL_CAPSULE | Freq: Four times a day (QID) | ORAL | 0 refills | Status: AC

## 2021-09-15 SURGICAL SUPPLY — 46 items
ABLATOR ASPIRATE 50D MULTI-PRT (SURGICAL WAND) ×3 IMPLANT
BANDAGE ESMARK 6X9 LF (GAUZE/BANDAGES/DRESSINGS) ×1 IMPLANT
BLADE EXCALIBUR 4.0MM X 13CM (MISCELLANEOUS) ×1
BLADE EXCALIBUR 4.0X13 (MISCELLANEOUS) ×2 IMPLANT
BNDG CMPR 9X6 STRL LF SNTH (GAUZE/BANDAGES/DRESSINGS) ×1
BNDG COHESIVE 3X5 TAN ST LF (GAUZE/BANDAGES/DRESSINGS) ×3 IMPLANT
BNDG ESMARK 6X9 LF (GAUZE/BANDAGES/DRESSINGS) ×3
BNDG GAUZE ELAST 4 BULKY (GAUZE/BANDAGES/DRESSINGS) ×3 IMPLANT
DRAPE ARTHROSCOPY W/POUCH 114 (DRAPES) ×3 IMPLANT
DRAPE INCISE IOBAN 66X45 STRL (DRAPES) ×3 IMPLANT
DRAPE U-SHAPE 47X51 STRL (DRAPES) ×3 IMPLANT
DRSG PAD ABDOMINAL 8X10 ST (GAUZE/BANDAGES/DRESSINGS) ×3 IMPLANT
DURAPREP 26ML APPLICATOR (WOUND CARE) ×3 IMPLANT
DW OUTFLOW CASSETTE/TUBE SET (MISCELLANEOUS) ×3 IMPLANT
EXCALIBUR 3.8MM X 13CM (MISCELLANEOUS) ×3 IMPLANT
GAUZE 4X4 16PLY ~~LOC~~+RFID DBL (SPONGE) ×3 IMPLANT
GAUZE SPONGE 4X4 12PLY STRL (GAUZE/BANDAGES/DRESSINGS) ×3 IMPLANT
GAUZE XEROFORM 1X8 LF (GAUZE/BANDAGES/DRESSINGS) ×3 IMPLANT
GLOVE SRG 8 PF TXTR STRL LF DI (GLOVE) ×1 IMPLANT
GLOVE SURG ENC MOIS LTX SZ7 (GLOVE) ×3 IMPLANT
GLOVE SURG ENC MOIS LTX SZ8 (GLOVE) ×3 IMPLANT
GLOVE SURG POLYISO LF SZ7 (GLOVE) ×3 IMPLANT
GLOVE SURG UNDER POLY LF SZ6.5 (GLOVE) ×3 IMPLANT
GLOVE SURG UNDER POLY LF SZ7 (GLOVE) ×3 IMPLANT
GLOVE SURG UNDER POLY LF SZ7.5 (GLOVE) ×3 IMPLANT
GLOVE SURG UNDER POLY LF SZ8 (GLOVE) ×3
GOWN STRL REUS W/TWL LRG LVL3 (GOWN DISPOSABLE) ×3 IMPLANT
GOWN STRL REUS W/TWL XL LVL3 (GOWN DISPOSABLE) ×6 IMPLANT
IV NS IRRIG 3000ML ARTHROMATIC (IV SOLUTION) ×6 IMPLANT
KIT TURNOVER CYSTO (KITS) ×3 IMPLANT
MANIFOLD NEPTUNE II (INSTRUMENTS) ×3 IMPLANT
NDL SAFETY ECLIPSE 18X1.5 (NEEDLE) ×1 IMPLANT
NEEDLE HYPO 18GX1.5 SHARP (NEEDLE) ×3
NEEDLE HYPO 22GX1.5 SAFETY (NEEDLE) ×3 IMPLANT
PACK ARTHROSCOPY DSU (CUSTOM PROCEDURE TRAY) ×3 IMPLANT
PACK BASIN DAY SURGERY FS (CUSTOM PROCEDURE TRAY) ×3 IMPLANT
PAD CAST 4YDX4 CTTN HI CHSV (CAST SUPPLIES) ×1 IMPLANT
PADDING CAST COTTON 4X4 STRL (CAST SUPPLIES) ×3
SUT ETHILON 4 0 PS 2 18 (SUTURE) ×3 IMPLANT
SYR 5ML LL (SYRINGE) ×3 IMPLANT
SYR CONTROL 10ML LL (SYRINGE) ×3 IMPLANT
TOWEL OR 17X26 10 PK STRL BLUE (TOWEL DISPOSABLE) ×3 IMPLANT
TUBE CONNECTING 12'X1/4 (SUCTIONS) ×1
TUBE CONNECTING 12X1/4 (SUCTIONS) ×2 IMPLANT
TUBING ARTHROSCOPY IRRIG 16FT (MISCELLANEOUS) ×3 IMPLANT
WRAP KNEE MAXI GEL POST OP (GAUZE/BANDAGES/DRESSINGS) ×3 IMPLANT

## 2021-09-15 NOTE — H&P (View-Only) (Signed)
Assisted Dr. Turk with left, knee block. Side rails up, monitors on throughout procedure. See vital signs in flow sheet. Tolerated Procedure well. 

## 2021-09-15 NOTE — Anesthesia Postprocedure Evaluation (Signed)
Anesthesia Post Note  Patient: Jill Lindsey  Procedure(s) Performed: KNEE ARTHROSCOPY WITH PARTIAL MEDIAL MENISECTOMY AND CHONDROPLASTY (Left: Knee)     Patient location during evaluation: PACU Anesthesia Type: General Level of consciousness: awake and alert, oriented and awake Pain management: pain level controlled Vital Signs Assessment: post-procedure vital signs reviewed and stable Respiratory status: spontaneous breathing, nonlabored ventilation and respiratory function stable Cardiovascular status: blood pressure returned to baseline and stable Postop Assessment: no apparent nausea or vomiting Anesthetic complications: no   No notable events documented.  Last Vitals:  Vitals:   09/15/21 1415 09/15/21 1430  BP: (!) 142/77 (!) 146/78  Pulse: 75 79  Resp: 18 18  Temp: 36.4 C (!) 36.4 C  SpO2: 95% 96%    Last Pain:  Vitals:   09/15/21 1430  TempSrc:   PainSc: 0-No pain                 Cecile Hearing

## 2021-09-15 NOTE — Progress Notes (Signed)
Assisted Dr. Turk with left, knee block. Side rails up, monitors on throughout procedure. See vital signs in flow sheet. Tolerated Procedure well. 

## 2021-09-15 NOTE — Interval H&P Note (Signed)
History and Physical Interval Note:  09/15/2021 12:40 PM  Jill Lindsey  has presented today for surgery, with the diagnosis of Left knee torn medial meniscus, chondral flaps.  The various methods of treatment have been discussed with the patient and family. After consideration of risks, benefits and other options for treatment, the patient has consented to  Procedure(s) with comments: KNEE ARTHROSCOPY WITH PARTIAL MEDIAL MENISECTOMY AND CHONDROPLASTY (Left) - with knee block 60 min as a surgical intervention.  The patient's history has been reviewed, patient examined, no change in status, stable for surgery.  I have reviewed the patient's chart and labs.  Questions were answered to the patient's satisfaction.     Lorry Furber ANDREW

## 2021-09-15 NOTE — Transfer of Care (Signed)
Immediate Anesthesia Transfer of Care Note  Patient: Jill Lindsey  Procedure(s) Performed: Procedure(s) (LRB): KNEE ARTHROSCOPY WITH PARTIAL MEDIAL MENISECTOMY AND CHONDROPLASTY (Left)  Patient Location: PACU  Anesthesia Type: General  Level of Consciousness: awake, sedated, patient cooperative and responds to stimulation  Airway & Oxygen Therapy: Patient Spontanous Breathing and Patient connected to [place] 02 and soft FM   Post-op Assessment: Report given to PACU RN, Post -op Vital signs reviewed and stable and Patient moving all extremities  Post vital signs: Reviewed and stable  Complications: No apparent anesthesia complications

## 2021-09-15 NOTE — H&P (Signed)
Jill Lindsey is an 60 y.o. female.   Chief Complaint: Left knee pain HPI: This is a very pleasant 60 year old female who has been dealing with left knee pain for about 5 years now.  She states that the pain subsided for about 4 years but has recently returned.  She states that she notices it mostly with quick turning or walking for an extended period of time.  She has tried anti-inflammatories as well as p.o. with minimal relief.  She does have mechanical symptoms that cause clicking locking and catching.  An MRI scan was ordered that showed extensive medial meniscal tears as well as old ACL and MCL sprains. She has tried conservative management up until this point and has elected to proceed with surgical management.   Past Medical History:  Diagnosis Date   Ankle pain    Bunion    COVID-19 01/2021   cough, fever, body aches, fatigue, loss of smell (All symptoms resolved except for sense of smell is not 100%, as of 09/07/21 per pt.)   Diverticulitis    Dysphagia    Hiatal hernia    History of heartburn    Posterior tibial tendinitis    Rotator cuff tear 03/2016   partial left rotator cuff tear    Past Surgical History:  Procedure Laterality Date   BUNIONECTOMY  11/06/2008   CALCANEAL OSTEOTOMY  2006, 2012   right, left   ENDOMETRIAL ABLATION  11/06/2006   ESOPHAGEAL DILATION     ESOPHAGOGASTRODUODENOSCOPY     ROTATOR CUFF REPAIR     2 surgeries on right and one on the left   TRIGGER FINGER RELEASE  11/06/2005    History reviewed. No pertinent family history. Social History:  reports that she quit smoking about 15 years ago. Her smoking use included cigarettes. She has a 20.00 pack-year smoking history. She has never used smokeless tobacco. She reports current alcohol use. She reports that she does not use drugs.  Allergies: No Known Allergies  No medications prior to admission.    No results found for this or any previous visit (from the past 48 hour(s)). No results  found.  Review of Systems  All other systems reviewed and are negative.  Height 5\' 2"  (1.575 m), weight 95.3 kg, last menstrual period 11/06/2006. Physical Exam Constitutional:      Appearance: Normal appearance.  HENT:     Head: Normocephalic and atraumatic.  Eyes:     Extraocular Movements: Extraocular movements intact.  Neck:     Vascular: No carotid bruit.  Cardiovascular:     Rate and Rhythm: Normal rate and regular rhythm.     Pulses: Normal pulses.     Heart sounds: Normal heart sounds.  Pulmonary:     Effort: Pulmonary effort is normal.     Breath sounds: Normal breath sounds.  Musculoskeletal:     Cervical back: Normal range of motion and neck supple.     Comments: On examination of the left knee reveals she has normal contours with landmarks. She has moderate effusion. Her range of motion is 5-130 degrees. She has a mildly positive Lachman, very firm endpoint, negative anterior drawer. Her MCL appears to be stable. She has extensive positive medial McMurray's, also positive lateral McMurray's. And she has patellofemoral crepitation pain with retropatellar compression  Skin:    General: Skin is warm and dry.     Capillary Refill: Capillary refill takes less than 2 seconds.  Neurological:     Mental Status: She is alert.  Assessment/Plan Left Knee medial meniscal tear: Patient has extensive medial meniscal tears. Surgical vs nonsurgical management was discussed. She has elected to proceed with surgery at this time. Risks and benefits of surgery discussed with the patient today. She will be having a left knee arthroscopy, PMM and chondroplasty.  Cherie Dark, PA 09/15/2021, 6:50 AM

## 2021-09-15 NOTE — Anesthesia Procedure Notes (Signed)
Anesthesia Regional Block: Knee block   Pre-Anesthetic Checklist: , timeout performed,  Correct Patient, Correct Site, Correct Laterality,  Correct Procedure, Correct Position, site marked,  Risks and benefits discussed,  Surgical consent,  Pre-op evaluation,  At surgeon's request and post-op pain management  Laterality: Left  Prep: chloraprep       Needles:  Injection technique: Single-shot  Needle Type: Quincke     Needle Length: 4cm  Needle Gauge: 22     Additional Needles:   Narrative:  Start time: 09/15/2021 11:27 AM End time: 09/15/2021 11:33 AM Injection made incrementally with aspirations every 5 mL.  Performed by: Personally  Anesthesiologist: Cecile Hearing, MD  Additional Notes: No pain on injection. No increased resistance to injection. Injection made in 5cc increments.  Patient tolerated procedure well.

## 2021-09-15 NOTE — Discharge Instructions (Addendum)
  Post Anesthesia Home Care Instructions  Activity: Get plenty of rest for the remainder of the day. A responsible individual must stay with you for 24 hours following the procedure.  For the next 24 hours, DO NOT: -Drive a car -Advertising copywriter -Drink alcoholic beverages -Take any medication unless instructed by your physician -Make any legal decisions or sign important papers.  Meals: Start with liquid foods such as gelatin or soup. Progress to regular foods as tolerated. Avoid greasy, spicy, heavy foods. If nausea and/or vomiting occur, drink only clear liquids until the nausea and/or vomiting subsides. Call your physician if vomiting continues.  Special Instructions/Symptoms: Your throat may feel dry or sore from the anesthesia or the breathing tube placed in your throat during surgery. If this causes discomfort, gargle with warm salt water. The discomfort should disappear within 24 hours.  **No Tylenol or acetaminophen until after 5pm today if needed.   **No ibuprofen, Advil, Aleve, Motrin, ketorolac, meloxicam, naproxen, or other NSAIDS until after 7:45pm today if needed.

## 2021-09-15 NOTE — Anesthesia Preprocedure Evaluation (Addendum)
Anesthesia Evaluation  Patient identified by MRN, date of birth, ID band Patient awake    Reviewed: Allergy & Precautions, NPO status , Patient's Chart, lab work & pertinent test results  Airway Mallampati: II  TM Distance: >3 FB Neck ROM: Full    Dental  (+) Teeth Intact, Dental Advisory Given   Pulmonary sleep apnea and Continuous Positive Airway Pressure Ventilation , former smoker,    Pulmonary exam normal breath sounds clear to auscultation       Cardiovascular negative cardio ROS Normal cardiovascular exam Rhythm:Regular Rate:Normal     Neuro/Psych negative neurological ROS     GI/Hepatic Neg liver ROS, hiatal hernia, GERD  ,  Endo/Other  negative endocrine ROSObesity   Renal/GU negative Renal ROS     Musculoskeletal Left knee torn medial meniscus, chondral flaps   Abdominal   Peds  Hematology negative hematology ROS (+)   Anesthesia Other Findings Day of surgery medications reviewed with the patient.  Reproductive/Obstetrics                            Anesthesia Physical Anesthesia Plan  ASA: 2  Anesthesia Plan: General and Regional   Post-op Pain Management:    Induction: Intravenous  PONV Risk Score and Plan: 3 and Midazolam, Dexamethasone and Ondansetron  Airway Management Planned: LMA  Additional Equipment:   Intra-op Plan:   Post-operative Plan: Extubation in OR  Informed Consent: I have reviewed the patients History and Physical, chart, labs and discussed the procedure including the risks, benefits and alternatives for the proposed anesthesia with the patient or authorized representative who has indicated his/her understanding and acceptance.     Dental advisory given  Plan Discussed with: CRNA  Anesthesia Plan Comments:         Anesthesia Quick Evaluation

## 2021-09-15 NOTE — Op Note (Signed)
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  Intra-articular block was administered by the anesthesiologist.  IV antibiotics were given within 1 hour the surgical incision time.  She has had the operating room placed supine position general anesthesia left thigh placed into a thigh holder and prepped with DuraPrep and draped in sterile fashion.  After timeout inferomedial inferolateral portals were established arthroscopic evaluation patellofemoral joint revealed minimal chondromalacia normal tracking ACL showed chronic partial tear PCL was intact medial side was inspected large complex tear posterior half medial meniscus with large pedunculated flaps utilizing basket motorized shaver cautery a partial medial meniscectomy was performed back to stable edge probably beveled and contoured.  Chondroplasty was performed on medial femoral condyle with a shaver back to the loose chondral flaps were removed.  The lateral side was inspected lateral meniscus was intact lateral femoral condyle unremarkable lateral to plateau showed grade IIchondromalacia and a very light chondroplasty was performed also for chondral flaps.  The knee was then reinspected no other adenopathies were noted irrigated arthroscopic clip was removed.  Portals were closed with 4  nylon suture.  Sterile dressings applied.  Take the operating PACU stable condition.  To be discharged home after stabilization.  She will start therapy next week viscosupplementation after 90 days I think her prognosis is very good.

## 2021-09-15 NOTE — Anesthesia Procedure Notes (Signed)
Procedure Name: LMA Insertion Date/Time: 09/15/2021 12:59 PM Performed by: Jessica Priest, CRNA Pre-anesthesia Checklist: Patient identified, Emergency Drugs available, Suction available, Patient being monitored and Timeout performed Patient Re-evaluated:Patient Re-evaluated prior to induction Oxygen Delivery Method: Circle system utilized Preoxygenation: Pre-oxygenation with 100% oxygen Induction Type: IV induction Ventilation: Mask ventilation without difficulty LMA: LMA inserted LMA Size: 4.0 Number of attempts: 1 Airway Equipment and Method: Bite block Placement Confirmation: positive ETCO2, breath sounds checked- equal and bilateral and CO2 detector Tube secured with: Tape Dental Injury: Teeth and Oropharynx as per pre-operative assessment

## 2021-09-16 ENCOUNTER — Encounter (HOSPITAL_BASED_OUTPATIENT_CLINIC_OR_DEPARTMENT_OTHER): Payer: Self-pay | Admitting: Specialist

## 2021-09-16 NOTE — Addendum Note (Signed)
Addendum  created 09/16/21 0936 by Jessica Priest, CRNA   Charge Capture section accepted

## 2021-11-24 DIAGNOSIS — M1712 Unilateral primary osteoarthritis, left knee: Secondary | ICD-10-CM | POA: Diagnosis not present

## 2021-11-24 DIAGNOSIS — Z4789 Encounter for other orthopedic aftercare: Secondary | ICD-10-CM | POA: Diagnosis not present

## 2022-03-27 DIAGNOSIS — M25562 Pain in left knee: Secondary | ICD-10-CM | POA: Diagnosis not present

## 2022-03-27 DIAGNOSIS — M23309 Other meniscus derangements, unspecified meniscus, unspecified knee: Secondary | ICD-10-CM | POA: Diagnosis not present

## 2022-04-07 DIAGNOSIS — M25562 Pain in left knee: Secondary | ICD-10-CM | POA: Diagnosis not present

## 2022-04-24 DIAGNOSIS — M1712 Unilateral primary osteoarthritis, left knee: Secondary | ICD-10-CM | POA: Diagnosis not present

## 2022-04-24 DIAGNOSIS — M25562 Pain in left knee: Secondary | ICD-10-CM | POA: Diagnosis not present

## 2022-05-29 DIAGNOSIS — M25562 Pain in left knee: Secondary | ICD-10-CM | POA: Diagnosis not present

## 2022-06-05 DIAGNOSIS — H05211 Displacement (lateral) of globe, right eye: Secondary | ICD-10-CM | POA: Diagnosis not present

## 2022-06-19 DIAGNOSIS — Z124 Encounter for screening for malignant neoplasm of cervix: Secondary | ICD-10-CM | POA: Diagnosis not present

## 2022-06-30 DIAGNOSIS — Z6837 Body mass index (BMI) 37.0-37.9, adult: Secondary | ICD-10-CM | POA: Diagnosis not present

## 2022-06-30 DIAGNOSIS — J0140 Acute pansinusitis, unspecified: Secondary | ICD-10-CM | POA: Diagnosis not present

## 2022-07-10 DIAGNOSIS — Z6837 Body mass index (BMI) 37.0-37.9, adult: Secondary | ICD-10-CM | POA: Diagnosis not present

## 2022-07-10 DIAGNOSIS — N3000 Acute cystitis without hematuria: Secondary | ICD-10-CM | POA: Diagnosis not present

## 2022-07-17 DIAGNOSIS — G4733 Obstructive sleep apnea (adult) (pediatric): Secondary | ICD-10-CM | POA: Diagnosis not present

## 2022-07-24 DIAGNOSIS — M1712 Unilateral primary osteoarthritis, left knee: Secondary | ICD-10-CM | POA: Diagnosis not present

## 2022-07-31 DIAGNOSIS — M1712 Unilateral primary osteoarthritis, left knee: Secondary | ICD-10-CM | POA: Diagnosis not present

## 2022-09-27 DIAGNOSIS — R0981 Nasal congestion: Secondary | ICD-10-CM | POA: Diagnosis not present

## 2022-09-27 DIAGNOSIS — Z6841 Body Mass Index (BMI) 40.0 and over, adult: Secondary | ICD-10-CM | POA: Diagnosis not present

## 2022-10-04 DIAGNOSIS — Z1389 Encounter for screening for other disorder: Secondary | ICD-10-CM | POA: Diagnosis not present

## 2022-10-04 DIAGNOSIS — G4733 Obstructive sleep apnea (adult) (pediatric): Secondary | ICD-10-CM | POA: Diagnosis not present

## 2022-10-04 DIAGNOSIS — R5383 Other fatigue: Secondary | ICD-10-CM | POA: Diagnosis not present

## 2022-10-04 DIAGNOSIS — R7303 Prediabetes: Secondary | ICD-10-CM | POA: Diagnosis not present

## 2022-10-18 DIAGNOSIS — R7303 Prediabetes: Secondary | ICD-10-CM | POA: Diagnosis not present

## 2022-10-18 DIAGNOSIS — Z6841 Body Mass Index (BMI) 40.0 and over, adult: Secondary | ICD-10-CM | POA: Diagnosis not present

## 2022-10-18 DIAGNOSIS — G4733 Obstructive sleep apnea (adult) (pediatric): Secondary | ICD-10-CM | POA: Diagnosis not present

## 2022-11-14 DIAGNOSIS — R7303 Prediabetes: Secondary | ICD-10-CM | POA: Diagnosis not present

## 2022-11-14 DIAGNOSIS — G4733 Obstructive sleep apnea (adult) (pediatric): Secondary | ICD-10-CM | POA: Diagnosis not present

## 2022-11-14 DIAGNOSIS — Z6839 Body mass index (BMI) 39.0-39.9, adult: Secondary | ICD-10-CM | POA: Diagnosis not present

## 2023-03-22 DIAGNOSIS — Z87891 Personal history of nicotine dependence: Secondary | ICD-10-CM | POA: Diagnosis not present

## 2023-03-22 DIAGNOSIS — G4733 Obstructive sleep apnea (adult) (pediatric): Secondary | ICD-10-CM | POA: Diagnosis not present

## 2023-03-22 DIAGNOSIS — Z791 Long term (current) use of non-steroidal anti-inflammatories (NSAID): Secondary | ICD-10-CM | POA: Diagnosis not present

## 2023-03-22 DIAGNOSIS — J3489 Other specified disorders of nose and nasal sinuses: Secondary | ICD-10-CM | POA: Diagnosis not present

## 2023-03-22 DIAGNOSIS — Z8249 Family history of ischemic heart disease and other diseases of the circulatory system: Secondary | ICD-10-CM | POA: Diagnosis not present

## 2023-03-22 DIAGNOSIS — I10 Essential (primary) hypertension: Secondary | ICD-10-CM | POA: Diagnosis not present

## 2023-03-22 DIAGNOSIS — Z6841 Body Mass Index (BMI) 40.0 and over, adult: Secondary | ICD-10-CM | POA: Diagnosis not present

## 2023-03-22 DIAGNOSIS — N3281 Overactive bladder: Secondary | ICD-10-CM | POA: Diagnosis not present

## 2023-03-22 DIAGNOSIS — E785 Hyperlipidemia, unspecified: Secondary | ICD-10-CM | POA: Diagnosis not present

## 2023-03-22 DIAGNOSIS — M1990 Unspecified osteoarthritis, unspecified site: Secondary | ICD-10-CM | POA: Diagnosis not present

## 2023-03-22 DIAGNOSIS — Z83438 Family history of other disorder of lipoprotein metabolism and other lipidemia: Secondary | ICD-10-CM | POA: Diagnosis not present

## 2023-04-04 DIAGNOSIS — Z8262 Family history of osteoporosis: Secondary | ICD-10-CM | POA: Diagnosis not present

## 2023-05-16 DIAGNOSIS — Z6837 Body mass index (BMI) 37.0-37.9, adult: Secondary | ICD-10-CM | POA: Diagnosis not present

## 2023-05-16 DIAGNOSIS — R7303 Prediabetes: Secondary | ICD-10-CM | POA: Diagnosis not present

## 2023-05-16 DIAGNOSIS — G4733 Obstructive sleep apnea (adult) (pediatric): Secondary | ICD-10-CM | POA: Diagnosis not present

## 2023-05-16 DIAGNOSIS — Z9189 Other specified personal risk factors, not elsewhere classified: Secondary | ICD-10-CM | POA: Diagnosis not present

## 2023-07-03 DIAGNOSIS — M1712 Unilateral primary osteoarthritis, left knee: Secondary | ICD-10-CM | POA: Diagnosis not present

## 2023-07-03 DIAGNOSIS — M545 Low back pain, unspecified: Secondary | ICD-10-CM | POA: Diagnosis not present

## 2023-07-10 DIAGNOSIS — Z01419 Encounter for gynecological examination (general) (routine) without abnormal findings: Secondary | ICD-10-CM | POA: Diagnosis not present

## 2023-07-10 DIAGNOSIS — Z1231 Encounter for screening mammogram for malignant neoplasm of breast: Secondary | ICD-10-CM | POA: Diagnosis not present

## 2023-07-13 ENCOUNTER — Other Ambulatory Visit: Payer: Self-pay | Admitting: Obstetrics and Gynecology

## 2023-07-13 ENCOUNTER — Encounter: Payer: Self-pay | Admitting: Obstetrics and Gynecology

## 2023-07-13 DIAGNOSIS — Z8262 Family history of osteoporosis: Secondary | ICD-10-CM

## 2023-07-17 DIAGNOSIS — M5136 Other intervertebral disc degeneration, lumbar region: Secondary | ICD-10-CM | POA: Diagnosis not present

## 2023-07-17 DIAGNOSIS — M545 Low back pain, unspecified: Secondary | ICD-10-CM | POA: Diagnosis not present

## 2023-07-18 DIAGNOSIS — Z6834 Body mass index (BMI) 34.0-34.9, adult: Secondary | ICD-10-CM | POA: Diagnosis not present

## 2023-07-18 DIAGNOSIS — E7800 Pure hypercholesterolemia, unspecified: Secondary | ICD-10-CM | POA: Diagnosis not present

## 2023-07-18 DIAGNOSIS — R7303 Prediabetes: Secondary | ICD-10-CM | POA: Diagnosis not present

## 2023-07-18 DIAGNOSIS — E669 Obesity, unspecified: Secondary | ICD-10-CM | POA: Diagnosis not present

## 2023-07-20 DIAGNOSIS — M5136 Other intervertebral disc degeneration, lumbar region: Secondary | ICD-10-CM | POA: Diagnosis not present

## 2023-07-20 DIAGNOSIS — M545 Low back pain, unspecified: Secondary | ICD-10-CM | POA: Diagnosis not present

## 2023-07-23 DIAGNOSIS — M5136 Other intervertebral disc degeneration, lumbar region: Secondary | ICD-10-CM | POA: Diagnosis not present

## 2023-07-23 DIAGNOSIS — M545 Low back pain, unspecified: Secondary | ICD-10-CM | POA: Diagnosis not present

## 2023-08-08 DIAGNOSIS — M51369 Other intervertebral disc degeneration, lumbar region without mention of lumbar back pain or lower extremity pain: Secondary | ICD-10-CM | POA: Diagnosis not present

## 2023-08-08 DIAGNOSIS — M545 Low back pain, unspecified: Secondary | ICD-10-CM | POA: Diagnosis not present

## 2023-08-10 ENCOUNTER — Ambulatory Visit
Admission: RE | Admit: 2023-08-10 | Discharge: 2023-08-10 | Disposition: A | Discharge status: Home / Self Care | Payer: 59 | Source: Ambulatory Visit | Attending: Obstetrics and Gynecology

## 2023-08-10 DIAGNOSIS — E349 Endocrine disorder, unspecified: Secondary | ICD-10-CM | POA: Diagnosis not present

## 2023-08-10 DIAGNOSIS — Z8262 Family history of osteoporosis: Secondary | ICD-10-CM

## 2023-08-10 DIAGNOSIS — N958 Other specified menopausal and perimenopausal disorders: Secondary | ICD-10-CM | POA: Diagnosis not present

## 2023-08-14 DIAGNOSIS — M545 Low back pain, unspecified: Secondary | ICD-10-CM | POA: Diagnosis not present

## 2023-08-14 DIAGNOSIS — M51369 Other intervertebral disc degeneration, lumbar region without mention of lumbar back pain or lower extremity pain: Secondary | ICD-10-CM | POA: Diagnosis not present

## 2023-08-20 DIAGNOSIS — K5730 Diverticulosis of large intestine without perforation or abscess without bleeding: Secondary | ICD-10-CM | POA: Diagnosis not present

## 2023-08-20 DIAGNOSIS — Z1211 Encounter for screening for malignant neoplasm of colon: Secondary | ICD-10-CM | POA: Diagnosis not present

## 2023-08-20 DIAGNOSIS — D124 Benign neoplasm of descending colon: Secondary | ICD-10-CM | POA: Diagnosis not present

## 2023-08-22 DIAGNOSIS — M545 Low back pain, unspecified: Secondary | ICD-10-CM | POA: Diagnosis not present

## 2023-08-22 DIAGNOSIS — M51362 Other intervertebral disc degeneration, lumbar region with discogenic back pain and lower extremity pain: Secondary | ICD-10-CM | POA: Diagnosis not present

## 2023-08-29 DIAGNOSIS — M1712 Unilateral primary osteoarthritis, left knee: Secondary | ICD-10-CM | POA: Diagnosis not present

## 2023-08-29 DIAGNOSIS — W19XXXA Unspecified fall, initial encounter: Secondary | ICD-10-CM | POA: Diagnosis not present

## 2023-08-29 DIAGNOSIS — M533 Sacrococcygeal disorders, not elsewhere classified: Secondary | ICD-10-CM | POA: Diagnosis not present

## 2023-09-03 DIAGNOSIS — Z87891 Personal history of nicotine dependence: Secondary | ICD-10-CM | POA: Diagnosis not present

## 2023-09-03 DIAGNOSIS — Z6835 Body mass index (BMI) 35.0-35.9, adult: Secondary | ICD-10-CM | POA: Diagnosis not present

## 2023-09-03 DIAGNOSIS — Z0000 Encounter for general adult medical examination without abnormal findings: Secondary | ICD-10-CM | POA: Diagnosis not present

## 2023-09-03 DIAGNOSIS — I700 Atherosclerosis of aorta: Secondary | ICD-10-CM | POA: Diagnosis not present

## 2023-09-03 DIAGNOSIS — Z23 Encounter for immunization: Secondary | ICD-10-CM | POA: Diagnosis not present

## 2023-09-03 DIAGNOSIS — J309 Allergic rhinitis, unspecified: Secondary | ICD-10-CM | POA: Diagnosis not present

## 2023-09-03 DIAGNOSIS — G4733 Obstructive sleep apnea (adult) (pediatric): Secondary | ICD-10-CM | POA: Diagnosis not present

## 2023-09-03 DIAGNOSIS — E669 Obesity, unspecified: Secondary | ICD-10-CM | POA: Diagnosis not present

## 2023-09-16 DIAGNOSIS — M545 Low back pain, unspecified: Secondary | ICD-10-CM | POA: Diagnosis not present

## 2023-09-30 DIAGNOSIS — M5416 Radiculopathy, lumbar region: Secondary | ICD-10-CM | POA: Diagnosis not present

## 2023-10-23 DIAGNOSIS — M5416 Radiculopathy, lumbar region: Secondary | ICD-10-CM | POA: Diagnosis not present

## 2023-12-25 DIAGNOSIS — I700 Atherosclerosis of aorta: Secondary | ICD-10-CM | POA: Diagnosis not present

## 2024-03-24 DIAGNOSIS — D692 Other nonthrombocytopenic purpura: Secondary | ICD-10-CM | POA: Diagnosis not present

## 2024-03-24 DIAGNOSIS — L570 Actinic keratosis: Secondary | ICD-10-CM | POA: Diagnosis not present

## 2024-05-12 DIAGNOSIS — Z6832 Body mass index (BMI) 32.0-32.9, adult: Secondary | ICD-10-CM | POA: Diagnosis not present

## 2024-05-12 DIAGNOSIS — M79645 Pain in left finger(s): Secondary | ICD-10-CM | POA: Diagnosis not present

## 2024-06-17 DIAGNOSIS — R7303 Prediabetes: Secondary | ICD-10-CM | POA: Diagnosis not present

## 2024-06-17 DIAGNOSIS — E7800 Pure hypercholesterolemia, unspecified: Secondary | ICD-10-CM | POA: Diagnosis not present

## 2024-06-17 DIAGNOSIS — R5383 Other fatigue: Secondary | ICD-10-CM | POA: Diagnosis not present

## 2024-06-17 DIAGNOSIS — E66811 Obesity, class 1: Secondary | ICD-10-CM | POA: Diagnosis not present

## 2024-06-17 DIAGNOSIS — Z6830 Body mass index (BMI) 30.0-30.9, adult: Secondary | ICD-10-CM | POA: Diagnosis not present

## 2024-07-30 DIAGNOSIS — Z6829 Body mass index (BMI) 29.0-29.9, adult: Secondary | ICD-10-CM | POA: Diagnosis not present

## 2024-07-30 DIAGNOSIS — E7800 Pure hypercholesterolemia, unspecified: Secondary | ICD-10-CM | POA: Diagnosis not present

## 2024-07-30 DIAGNOSIS — R7303 Prediabetes: Secondary | ICD-10-CM | POA: Diagnosis not present

## 2024-07-30 DIAGNOSIS — R5383 Other fatigue: Secondary | ICD-10-CM | POA: Diagnosis not present

## 2024-07-30 DIAGNOSIS — E66811 Obesity, class 1: Secondary | ICD-10-CM | POA: Diagnosis not present

## 2024-08-07 DIAGNOSIS — Z01419 Encounter for gynecological examination (general) (routine) without abnormal findings: Secondary | ICD-10-CM | POA: Diagnosis not present

## 2024-08-07 DIAGNOSIS — Z1231 Encounter for screening mammogram for malignant neoplasm of breast: Secondary | ICD-10-CM | POA: Diagnosis not present

## 2024-08-14 DIAGNOSIS — H04123 Dry eye syndrome of bilateral lacrimal glands: Secondary | ICD-10-CM | POA: Diagnosis not present

## 2024-08-14 DIAGNOSIS — H11002 Unspecified pterygium of left eye: Secondary | ICD-10-CM | POA: Diagnosis not present

## 2024-08-14 DIAGNOSIS — H2513 Age-related nuclear cataract, bilateral: Secondary | ICD-10-CM | POA: Diagnosis not present

## 2024-08-14 DIAGNOSIS — H25013 Cortical age-related cataract, bilateral: Secondary | ICD-10-CM | POA: Diagnosis not present

## 2024-08-14 DIAGNOSIS — H0100B Unspecified blepharitis left eye, upper and lower eyelids: Secondary | ICD-10-CM | POA: Diagnosis not present

## 2024-08-14 DIAGNOSIS — H0100A Unspecified blepharitis right eye, upper and lower eyelids: Secondary | ICD-10-CM | POA: Diagnosis not present

## 2024-09-04 DIAGNOSIS — W908XXS Exposure to other nonionizing radiation, sequela: Secondary | ICD-10-CM | POA: Diagnosis not present

## 2024-09-04 DIAGNOSIS — L821 Other seborrheic keratosis: Secondary | ICD-10-CM | POA: Diagnosis not present

## 2024-09-04 DIAGNOSIS — L570 Actinic keratosis: Secondary | ICD-10-CM | POA: Diagnosis not present

## 2024-09-04 DIAGNOSIS — L814 Other melanin hyperpigmentation: Secondary | ICD-10-CM | POA: Diagnosis not present

## 2024-09-04 DIAGNOSIS — D1801 Hemangioma of skin and subcutaneous tissue: Secondary | ICD-10-CM | POA: Diagnosis not present

## 2024-09-04 DIAGNOSIS — L578 Other skin changes due to chronic exposure to nonionizing radiation: Secondary | ICD-10-CM | POA: Diagnosis not present

## 2024-09-09 DIAGNOSIS — Z6830 Body mass index (BMI) 30.0-30.9, adult: Secondary | ICD-10-CM | POA: Diagnosis not present

## 2024-09-09 DIAGNOSIS — Z23 Encounter for immunization: Secondary | ICD-10-CM | POA: Diagnosis not present

## 2024-09-09 DIAGNOSIS — I700 Atherosclerosis of aorta: Secondary | ICD-10-CM | POA: Diagnosis not present

## 2024-09-09 DIAGNOSIS — E669 Obesity, unspecified: Secondary | ICD-10-CM | POA: Diagnosis not present

## 2024-09-09 DIAGNOSIS — G4733 Obstructive sleep apnea (adult) (pediatric): Secondary | ICD-10-CM | POA: Diagnosis not present

## 2024-09-09 DIAGNOSIS — Z0000 Encounter for general adult medical examination without abnormal findings: Secondary | ICD-10-CM | POA: Diagnosis not present

## 2024-09-24 DIAGNOSIS — Z6829 Body mass index (BMI) 29.0-29.9, adult: Secondary | ICD-10-CM | POA: Diagnosis not present

## 2024-09-24 DIAGNOSIS — E7800 Pure hypercholesterolemia, unspecified: Secondary | ICD-10-CM | POA: Diagnosis not present

## 2024-09-24 DIAGNOSIS — E663 Overweight: Secondary | ICD-10-CM | POA: Diagnosis not present

## 2024-09-24 DIAGNOSIS — R5383 Other fatigue: Secondary | ICD-10-CM | POA: Diagnosis not present

## 2024-09-24 DIAGNOSIS — R7303 Prediabetes: Secondary | ICD-10-CM | POA: Diagnosis not present

## 2024-11-03 DIAGNOSIS — G4733 Obstructive sleep apnea (adult) (pediatric): Secondary | ICD-10-CM | POA: Diagnosis not present
